# Patient Record
Sex: Female | Born: 1955 | Race: White | Hispanic: No
Health system: Southern US, Community
[De-identification: ages and names within clinical notes are randomized; demographics above are authoritative.]

---

## 2012-10-31 LAB — COMPREHENSIVE METABOLIC PANEL
Albumin: 3.4 g/dL (ref 3.4–5.0)
Alkaline Phosphatase: 88 U/L (ref 50–136)
Calcium, Total: 8.2 mg/dL — ABNORMAL LOW (ref 8.5–10.1)
Chloride: 103 mmol/L (ref 98–107)
Co2: 26 mmol/L (ref 21–32)
Creatinine: 0.63 mg/dL (ref 0.60–1.30)
EGFR (Non-African Amer.): >60
Glucose: 105 mg/dL — ABNORMAL HIGH (ref 65–99)
Osmolality: 271 (ref 275–301)
SGOT(AST): 20 U/L (ref 15–37)
Sodium: 136 mmol/L (ref 136–145)
Total Protein: 8.2 g/dL (ref 6.4–8.2)

## 2012-10-31 LAB — CBC WITH DIFFERENTIAL/PLATELET
Basophil %: 0.4 %
Eosinophil #: 0 10*3/uL (ref 0.0–0.7)
Eosinophil %: 0.1 %
HCT: 35.4 % (ref 35.0–47.0)
HGB: 11.9 g/dL — ABNORMAL LOW (ref 12.0–16.0)
Lymphocyte #: 1 10*3/uL (ref 1.0–3.6)
MCH: 28.3 pg (ref 26.0–34.0)
MCHC: 33.6 g/dL (ref 32.0–36.0)
MCV: 84 fL (ref 80–100)
Monocyte #: 1.1 x10 3/mm — ABNORMAL HIGH (ref 0.2–0.9)
Monocyte %: 17.3 %
Neutrophil #: 4.4 10*3/uL (ref 1.4–6.5)
Platelet: 283 10*3/uL (ref 150–440)
RBC: 4.21 10*6/uL (ref 3.80–5.20)
RDW: 15 % — ABNORMAL HIGH (ref 11.5–14.5)

## 2012-10-31 LAB — URINALYSIS, COMPLETE
Bacteria: NONE SEEN
Bilirubin,UR: NEGATIVE
Protein: NEGATIVE
RBC,UR: <1 /HPF (ref 0–5)
Specific Gravity: 1.005 (ref 1.003–1.030)
Squamous Epithelial: NONE SEEN
WBC UR: <1 /HPF (ref 0–5)

## 2012-10-31 LAB — RAPID INFLUENZA A&B ANTIGENS

## 2012-11-01 ENCOUNTER — Inpatient Hospital Stay: Payer: Self-pay | Admitting: Internal Medicine

## 2012-11-01 LAB — COMPREHENSIVE METABOLIC PANEL
Albumin: 2.9 g/dL — ABNORMAL LOW (ref 3.4–5.0)
Calcium, Total: 7.6 mg/dL — ABNORMAL LOW (ref 8.5–10.1)
EGFR (African American): >60
EGFR (Non-African Amer.): >60
Glucose: 117 mg/dL — ABNORMAL HIGH (ref 65–99)
SGPT (ALT): 37 U/L (ref 12–78)
Sodium: 139 mmol/L (ref 136–145)
Total Protein: 6.9 g/dL (ref 6.4–8.2)

## 2012-11-01 LAB — CBC WITH DIFFERENTIAL/PLATELET
Basophil #: 0 10*3/uL (ref 0.0–0.1)
HCT: 33.7 % — ABNORMAL LOW (ref 35.0–47.0)
HGB: 11 g/dL — ABNORMAL LOW (ref 12.0–16.0)
MCH: 28 pg (ref 26.0–34.0)
MCHC: 32.8 g/dL (ref 32.0–36.0)
Monocyte %: 18.5 %
Neutrophil %: 56.8 %
RBC: 3.94 10*6/uL (ref 3.80–5.20)
WBC: 5 10*3/uL (ref 3.6–11.0)

## 2012-11-01 LAB — LIPID PANEL
Cholesterol: 109 mg/dL (ref 0–200)
Ldl Cholesterol, Calc: 71 mg/dL (ref 0–100)
VLDL Cholesterol, Calc: 14 mg/dL (ref 5–40)

## 2012-11-02 LAB — URINE CULTURE

## 2012-11-06 LAB — CULTURE, BLOOD (SINGLE)

## 2013-12-16 IMAGING — CR DG CHEST 2V
1 series · 2 of 2 positions shown · non-contrast
Comparison: none

REASON FOR EXAM: sob
COMMENTS:

PROCEDURE:     DXR - DXR CHEST PA (OR AP) AND LATERAL  - October 31, 2012  [DATE]
RESULT:     Mild interstitial prominence noted bilaterally. Heart size
normal. No alveolar infiltrates noted.

[Series 1: w chest pa · 0.14mm/px · 2 of 2 slices shown]
[im 1/2]
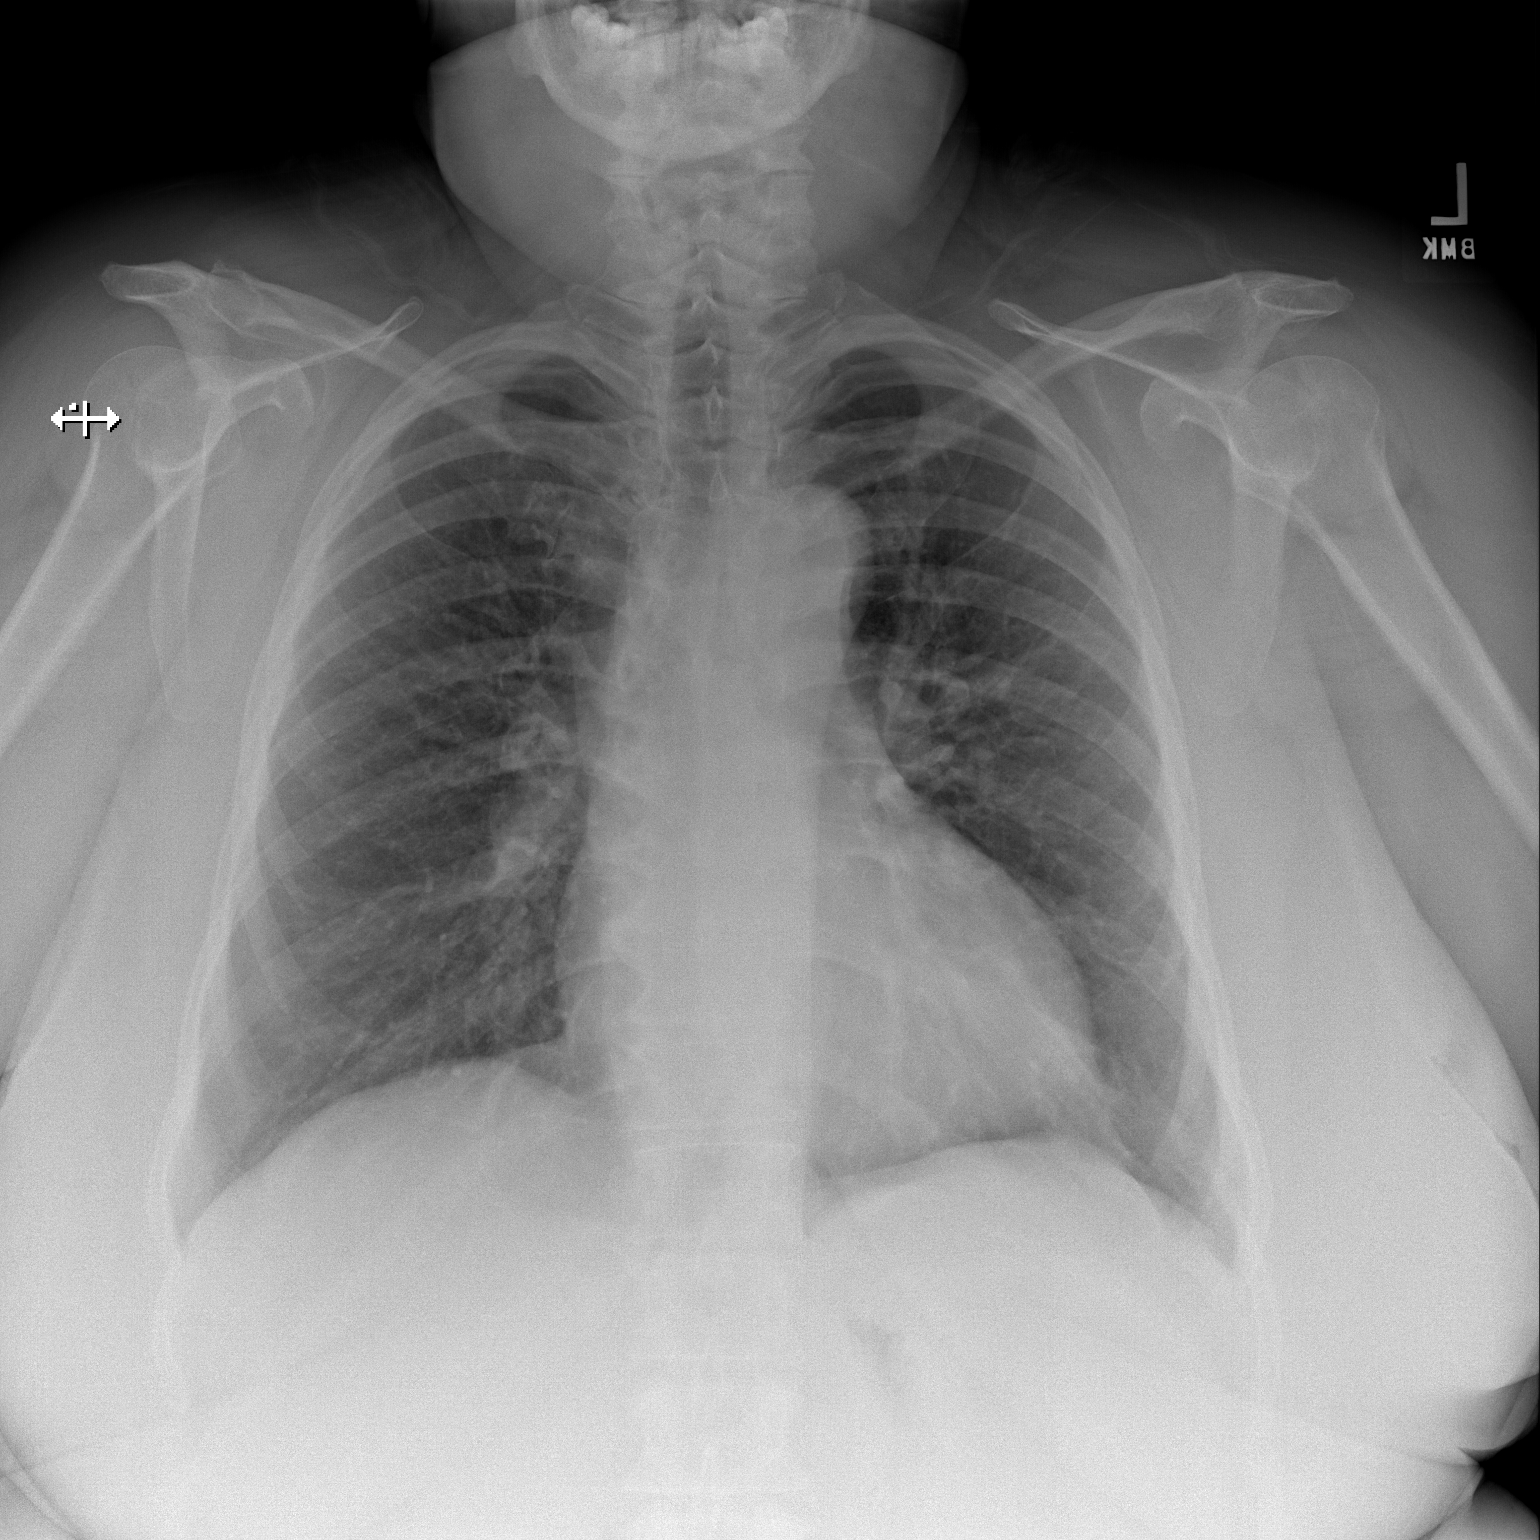
[im 2/2]
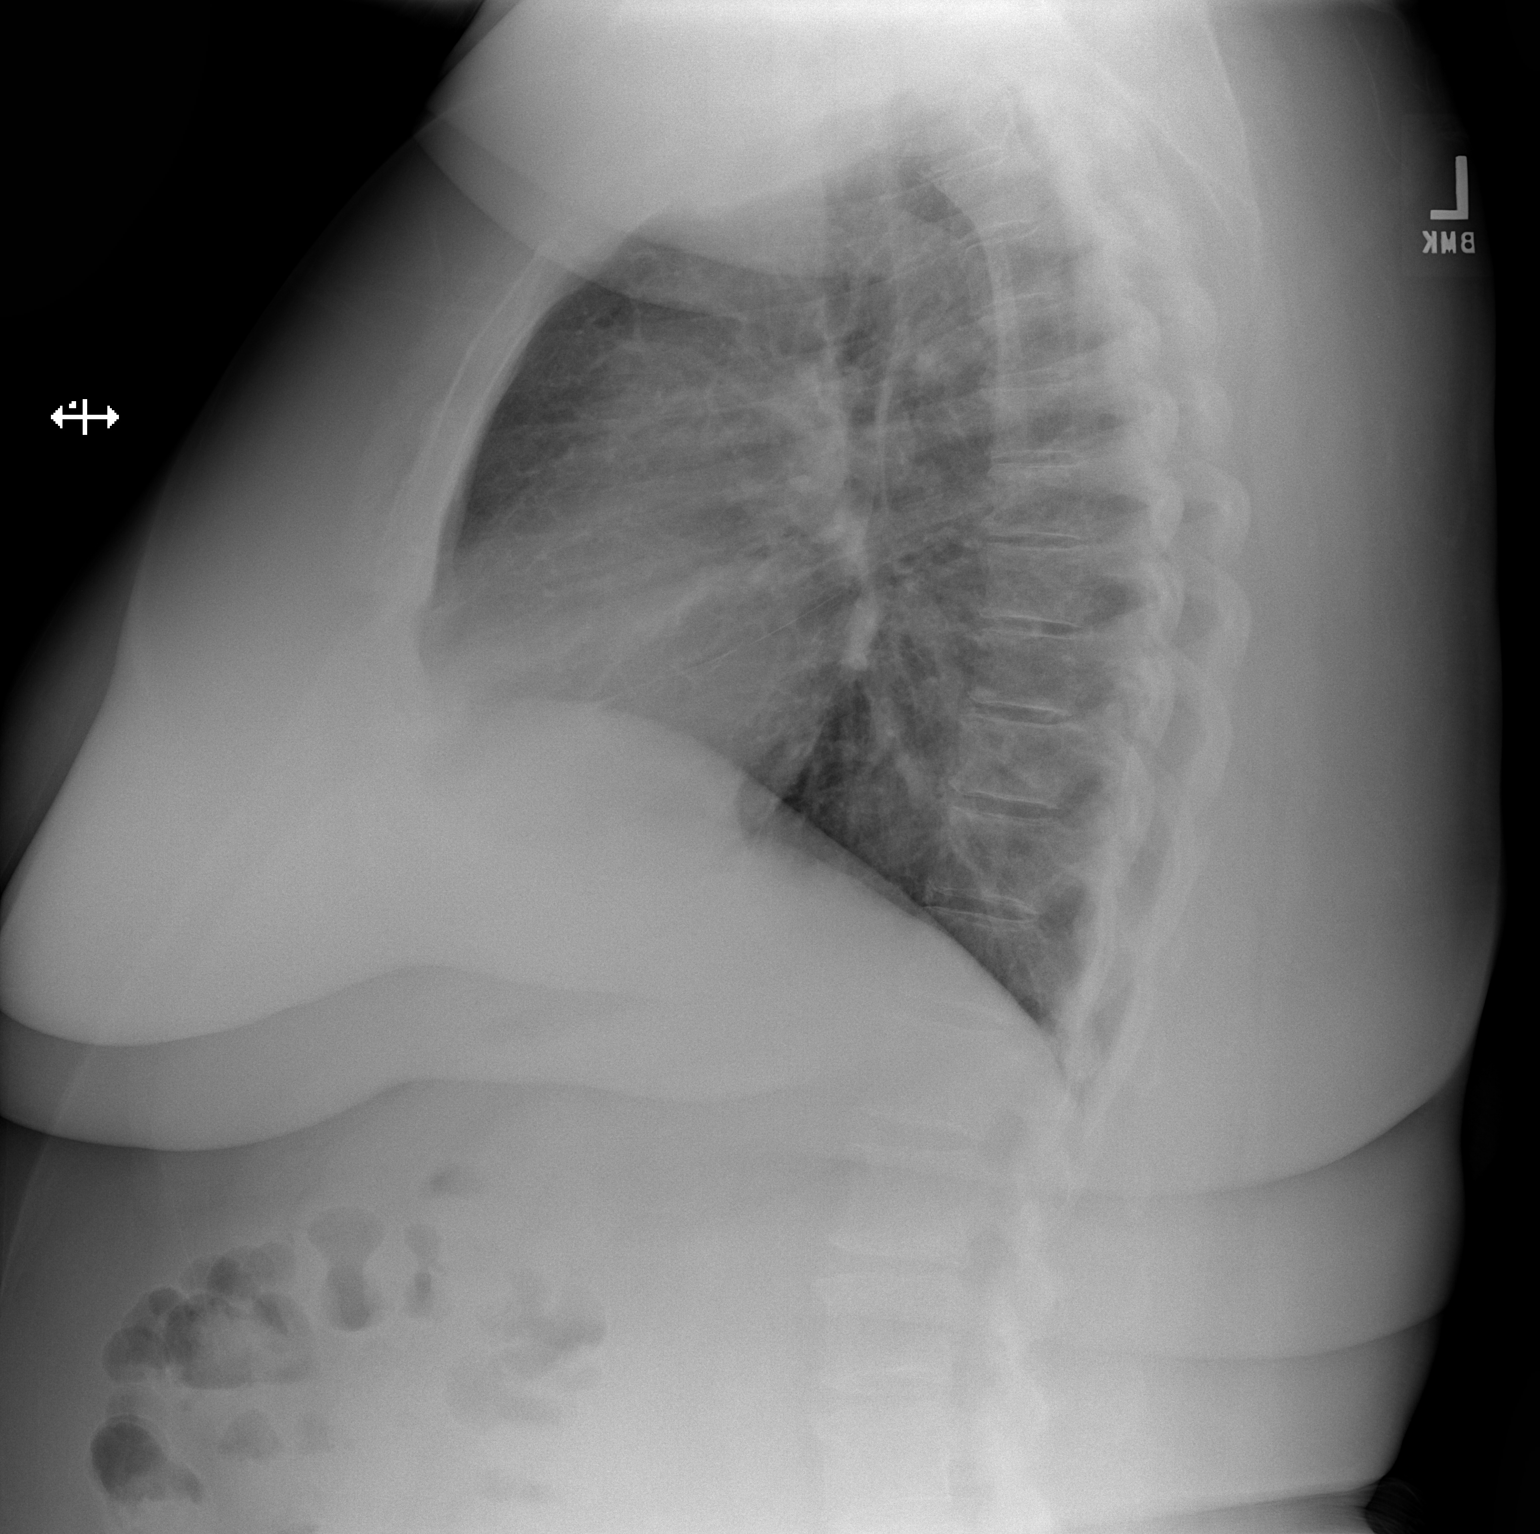

[2 of 2 positions shown; findings below may reference images not displayed]

IMPRESSION: Mild interstitial prominence noted bilaterally. Mild
pneumonitis cannot be excluded.

## 2015-02-05 NOTE — Discharge Summary (Signed)
PATIENT NAME:  Debra Mitchell, Debra Mitchell MR#:  [id number] DATE OF BIRTH:  08/07/1956  DATE OF ADMISSION:  11/01/2012 DATE OF DISCHARGE:  11/03/2012  ADMITTING PHYSICIAN: Dr. Amado CoeGouru.   DISCHARGING PHYSICIAN: Dr. Enid Baasadhika Revecca Nachtigal.  PRIMARY CARE PHYSICIAN: Non-local.   DISCHARGE DIAGNOSES:  1.  Influenza with bronchitis.  2.  Obstructive sleep apnea.   DISCHARGE HOME MEDICATIONS:  1.  Prednisone taper start at 60 mg p.o. daily and taper off by 10 mg every day, 2.  Tamiflu 75 mg 1 capsule p.o. b.i.d. for 3 more days.  3.  Ambien 5 mg at bedtime as needed for sleep.  4.  Combivent Respimat inhaler 1 puff 4 times a day.  5.  Levaquin 500 mg p.o. daily for 5 more days.   DISCHARGE OXYGEN: None.   DISCHARGE DIET: Regular diet.   DISCHARGE ACTIVITY: As tolerated.    FOLLOWUP INSTRUCTIONS: Primary care physician follow-up in 2 weeks.   LABORATORIES AND IMAGING STUDIES: WBC 5.0, hemoglobin 11.0, hematocrit 33.7, platelet count 238. Sodium 139, potassium 3.5, chloride 106, bicarbonate 27, BUN 9, creatinine 0.63, glucose 117, and calcium of 7.6. ALT 37, AST 43, alkaline phosphatase 64, total bilirubin 0.2, albumin 3.9. LDL 71, HDL 24, total cholesterol [id number], triglycerides 16. Blood cultures negative. One of the blood cultures are positive for coagulase-negative staph, which will be contaminant. Urinalysis negative for infection. Influenza antigen test positive for influenza A. Chest x-ray showing mild interstitial prominence bilaterally, otherwise no acute findings.   BRIEF HOSPITAL COURSE: The patient is a 59 year old Caucasian female with past medical history significant for obstructive sleep apnea on CPAP, who works as a Naval architecttruck driver and has been exposed to several sick people over the past week. She presents to the ER complaining of fever, shortness of breath, body aches and cough. She had a flu test done in the ED with positive influenza with bronchitis. She was having productive sputum and ongoing  fevers. Chest x-ray with mild interstitial pneumonitis, so she had blood cultures sent that were negative. Of course, one of which came back positive for coagulase-negative staph for which she was covered with vancomycin until the final cultures were back and it was a contaminant. Second set of blood culture is negative. She was started on Tamiflu in the hospital. She has remained fever free for more than 24 hours prior to discharge and was covered with antibiotics for her bronchitis and is being discharged on Levaquin. She did have a reactive airway disease with wheezing and was started on IV steroids and being changed to oral steroids at the time of discharge. Her course has been otherwise uneventful in the hospital. She continues to use her CPAP here and she initially required oxygen, but at the time discharge she was doing well on room air. She was discharged in stable condition to home.   DISCHARGE DISPOSITION: Home.   DISCHARGE CONDITION: Stable.   TIME SPENT ON DISCHARGE: 45 minutes.    ____________________________ Enid Baasadhika Kaiana Marion, MD rk:aw D: 11/04/2012 17:01:12 ET T: 11/05/2012 07:33:52 ET JOB#: [id number]  cc: Enid Baasadhika Yee Joss, MD, <Dictator> Enid BaasADHIKA Yanni Ruberg MD ELECTRONICALLY SIGNED 11/06/2012 19:32

## 2015-02-05 NOTE — H&P (Signed)
PATIENT NAME:  Debra Mitchell, AYE MR#:  [id number] DATE OF BIRTH:  Nov 02, 1955  DATE OF ADMISSION:  11/01/2012  PRIMARY CARE PHYSICIAN:  None local.    REFERRING PHYSICIAN:  Dr. Sharma Covert.   CHIEF COMPLAINT:  Cough, fever, shortness of breath and body aches.   HISTORY OF PRESENT ILLNESS:  The patient is a 59 year old female who lives in [place], but works as a Naval architect, is presenting to the ER with a chief complaint of dry cough, congestion for the past one week.  The patient was evaluated by Urgent Care in a different state and she was given amoxicillin on last Sunday for acute sinusitis.  The patient has been taking amoxicillin with no improvement.  Subsequently, she started feeling short of breath and also developing a temperature as high as 102 degrees Fahrenheit.  Complaining of myalgias for the past 1 to 2 days associated with severe headache.  As patient's shortness of breath has been progressively getting worse, she came in to ER.  In the ER, patient was initially hypoxemic and her pulse ox was 93% on room air as reported by the ER physician.  With ambulation, she was desaturating up to 91%.  Temperature initially was 101.4 degrees Fahrenheit.  Her influenza test was positive and chest x-ray has revealed patchy infiltrates.  The patient is complaining of headache in the frontal area.  She still looks congested and flushed.  With 2 liters of oxygen, patient started saturating at around 95% to 96%.   Blood cultures were obtained and the patient has received IV Rocephin and Zithromax. The hospitalist team is called to admit the patient.   PAST MEDICAL HISTORY:  Obstructive sleep apnea and uses CPAP at bedtime.   PAST SURGICAL HISTORY:  Cholecystectomy, tubal ligation, left knee surgery.   ALLERGIES:  She has no known drug allergies.   HOME MEDICATIONS:  Amoxicillin, which was given recently in Urgent Care for sinusitis.  Otherwise, she does not take any medications.   PSYCHOSOCIAL HISTORY:   Lives at home.  She used to smoke, but quit smoking.  Denies any alcohol or illicit drug usage.   FAMILY HISTORY:  Mother was deceased in a motor vehicle accident when patient was 49 years old.  Dad is healthy.  Denies any history of cancer, diabetes, heart attacks.     REVIEW OF SYSTEMS:  CONSTITUTIONAL:  Complaining of fever, fatigue, weakness.  Complaining of headache.  Denies any weight loss or weight gain.  EYES:  Denies any blurry vision, glaucoma, cataracts.  EARS, NOSE, THROAT:  Denies tinnitus, ear pain.  Complaining of postnasal drip, sinus pain, discharge from the nose.  Denies any difficulty in swallowing.  RESPIRATORY:  Positive cough.  Denies any wheezing, hemoptysis.  Complaining of shortness of breath.  Denies any painful respiration.  CARDIOVASCULAR:  Denies any chest pain, orthopnea, edema, erythema, palpitations, syncope.  GASTROINTESTINAL:  Denies nausea, vomiting, diarrhea, GERD, jaundice.  GENITOURINARY:  Denies dysuria, hematuria, renal calculi.  GYNECOLOGIC AND BREASTS:  Denies breast mass, vaginal discharge.   ENDOCRINE:  Denies polyuria, polyphagia, nocturia, thyroid problems.  HEMATOLOGIC:  Denies anemia or easy bruising.  INTEGUMENTARY:  Denies acne, rash, lesions.  MUSCULOSKELETAL:  Denies pain in the neck, shoulder, knee or hip.  Denies any swelling. NEUROLOGIC:  Denies numbness, weakness, vertigo, ataxia.  PSYCHIATRIC:  Denies anxiety, insomnia, ADD, OCD.   PHYSICAL EXAMINATION: VITAL SIGNS:  Temperature 101.4 degrees Fahrenheit, pulse 99, respiratory rate 22, blood pressure 131/66, pulse ox 93% on room air.  GENERAL  APPEARANCE:  Not under acute distress, moderately built, obese.  HEENT:  Normocephalic, atraumatic.  Pupils are equally reacting to light and accommodation.  Positive nasal discharge, nasal congestion.  Frontal sinus tenderness is positive.  Positive postnasal drip.  Tympanic membranes are intact.  NECK:  Supple.  No JVD.  No thyromegaly.  LUNGS:   Moderate air entry.  Diffuse crackles are present.  No accessory muscle usage.  No anterior chest wall tenderness.   CARDIAC:  S1, S2 normal, regular rate and rhythm, tachycardic.  No murmurs.  GASTROINTESTINAL:  Soft.  Bowel sounds are positive in all four quadrants.  Obese, nontender, nondistended.  No masses. NEUROLOGIC:  Awake, alert, oriented x 3.  Motor and sensory are grossly intact.  Reflexes are 2+. SKIN:  Warm to touch.  Normal turgor.  No rashes.  No lesions.  EXTREMITIES:  No edema.  No cyanosis.  No clubbing.  MUSCULOSKELETAL:  No joint effusion, tenderness or erythema.   LABORATORY AND IMAGING STUDIES:  Chest x-ray has revealed patchy infiltrates.  Glucose 105, BUN 9, creatinine 0.63, sodium 136, potassium 3.4, GFR greater than 60, anion gap 7, calcium 8.2.  LFTs are within normal range.  WBC 6.6, hemoglobin 11.9, hematocrit 35.4, platelet count is 283,000.  Influenza swab is positive for influenza A.  Negative influenza B.  UA, blood is 1+, leukocyte esterase, nitrites are negative, color yellow, clarity clear.   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  1.  Pneumonia, community-acquired.  Plan, sputum culture and sensitivities ordered.  IV Rocephin and Zithromax will be given.  The patient will be receiving albuterol neb treatments as needed basis for shortness of breath.  2.  Acute sinusitis.  Continue IV Rocephin and Zithromax.  3.  Influenza positive.  The patient will be on droplet precautions.  We will give her Tamiflu 75 mg by mouth twice daily and hydration with IV fluids.  4.  Hypokalemia.  Replete via IV fluids.  5.  Chronic history of obstructive sleep apnea.  Continue CPAP at bedtime.  6.  Gastrointestinal  prophylaxis with ranitidine.  7.  Deep vein thrombosis prophylaxis.  The patient is ambulatory and she is at low risk.  No need of anticoagulants at this time.   CODE STATUS:  FULL CODE.   The diagnosis and plan of care was discussed in detail with the patient.  She is aware of the plan.  TOTAL TIME SPENT ON THE HISTORY AND PHYSICAL AND COORDINATION OF CARE:  Fifty minutes.     ____________________________ Ramonita LabAruna Susane Bey, MD ag:ea D: 11/01/2012 00:28:00 ET T: 11/01/2012 01:03:43 ET JOB#: [id number]  cc: Ramonita LabAruna Sahmya Arai, MD, <Dictator> Ramonita LabARUNA Addilyne Backs MD ELECTRONICALLY SIGNED 11/02/2012 5:55
